# Patient Record
Sex: Male | Born: 1945 | Race: Black or African American | Hispanic: No | Smoking: Never smoker
Health system: Southern US, Community
[De-identification: ages and names within clinical notes are randomized; demographics above are authoritative.]

## PROBLEM LIST (undated history)

## (undated) DIAGNOSIS — T7840XA Allergy, unspecified, initial encounter: Secondary | ICD-10-CM

## (undated) DIAGNOSIS — M1990 Unspecified osteoarthritis, unspecified site: Secondary | ICD-10-CM

## (undated) HISTORY — DX: Unspecified osteoarthritis, unspecified site: M19.90

## (undated) HISTORY — DX: Allergy, unspecified, initial encounter: T78.40XA

---

## 2012-06-25 ENCOUNTER — Ambulatory Visit (INDEPENDENT_AMBULATORY_CARE_PROVIDER_SITE_OTHER): Payer: Medicare Other | Admitting: Family Medicine

## 2012-06-25 ENCOUNTER — Ambulatory Visit: Payer: Medicare Other

## 2012-06-25 VITALS — BP 168/90 | HR 68 | Temp 98.1°F | Resp 18 | Ht 64.5 in | Wt 177.0 lb

## 2012-06-25 DIAGNOSIS — E119 Type 2 diabetes mellitus without complications: Secondary | ICD-10-CM

## 2012-06-25 DIAGNOSIS — D649 Anemia, unspecified: Secondary | ICD-10-CM

## 2012-06-25 DIAGNOSIS — R112 Nausea with vomiting, unspecified: Secondary | ICD-10-CM

## 2012-06-25 DIAGNOSIS — J309 Allergic rhinitis, unspecified: Secondary | ICD-10-CM

## 2012-06-25 LAB — COMPREHENSIVE METABOLIC PANEL
AST: 32 U/L (ref 0–37)
Alkaline Phosphatase: 167 U/L — ABNORMAL HIGH (ref 39–117)
BUN: 52 mg/dL — ABNORMAL HIGH (ref 6–23)
Creat: 5.09 mg/dL — ABNORMAL HIGH (ref 0.50–1.35)
Potassium: 4.5 mEq/L (ref 3.5–5.3)
Total Bilirubin: 0.3 mg/dL (ref 0.3–1.2)

## 2012-06-25 LAB — POCT CBC
MCH, POC: 28.6 pg (ref 27–31.2)
MCHC: 31.8 g/dL (ref 31.8–35.4)
MID (cbc): 0.3 (ref 0–0.9)
MPV: 8.3 fL (ref 0–99.8)
POC MID %: 5.8 %M (ref 0–12)
Platelet Count, POC: 244 10*3/uL (ref 142–424)
RBC: 4.33 M/uL — AB (ref 4.69–6.13)
RDW, POC: 12.7 %
WBC: 5.7 10*3/uL (ref 4.6–10.2)

## 2012-06-25 NOTE — Patient Instructions (Signed)
pepto bismol for nausea if needed. Continue to drink plenty of fluids, small portion sizes and bland foods such as soup and crackers.  If less food intake, can decrease your insulin dose to 1/2 for the next 2 days, then recheck in office. Keep a record of your blood sugars 3 times per day and watch for any symptoms of low blood sugar - see below for precautions and treatment. If blood sugar reading hiher than 350 - call or return to clinic, or to an emergency room.  your lab results should be available at your next visit in 2 days, and we can discuss further workup of your symptoms at that time. Return to the clinic or go to the nearest emergency room if any of your symptoms worsen or new symptoms occur.  Hypoglycemia (Low Blood Sugar) Hypoglycemia is when the glucose (sugar) in your blood is too low. Hypoglycemia can happen for many reasons. It can happen to people with or without diabetes. Hypoglycemia can develop quickly and can be a medical emergency.  CAUSES  Having hypoglycemia does not mean that you will develop diabetes. Different causes include:  Missed or delayed meals or not enough carbohydrates eaten.  Medication overdose. This could be by accident or deliberate. If by accident, your medication may need to be adjusted or changed.  Exercise or increased activity without adjustments in carbohydrates or medications.  A nerve disorder that affects body functions like your heart rate, blood pressure and digestion (autonomic neuropathy).  A condition where the stomach muscles do not function properly (gastroparesis). Therefore, medications may not absorb properly.  The inability to recognize the signs of hypoglycemia (hypoglycemic unawareness).  Absorption of insulin  may be altered.  Alcohol consumption.  Pregnancy/menstrual cycles/postpartum. This may be due to hormones.  Certain kinds of tumors. This is very rare. SYMPTOMS   Sweating.  Hunger.  Dizziness.  Blurred  vision.  Drowsiness.  Weakness.  Headache.  Rapid heart beat.  Shakiness.  Nervousness. DIAGNOSIS  Diagnosis is made by monitoring blood glucose in one or all of the following ways:  Fingerstick blood glucose monitoring.  Laboratory results. TREATMENT  If you think your blood glucose is low:  Check your blood glucose, if possible. If it is less than 70 mg/dl, take one of the following:  3-4 glucose tablets.   cup juice (prefer clear like apple).   cup "regular" soda pop.  1 cup milk.  -1 tube of glucose gel.  5-6 hard candies.  Do not over treat because your blood glucose (sugar) will only go too high.  Wait 15 minutes and recheck your blood glucose. If it is still less than 70 mg/dl (or below your target range), repeat treatment.  Eat a snack if it is more than one hour until your next meal. Sometimes, your blood glucose may go so low that you are unable to treat yourself. You may need someone to help you. You may even pass out or be unable to swallow. This may require you to get an injection of glucagon, which raises the blood glucose. HOME CARE INSTRUCTIONS  Check blood glucose as recommended by your caregiver.  Take medication as prescribed by your caregiver.  Follow your meal plan. Do not skip meals. Eat on time.  If you are going to drink alcohol, drink it only with meals.  Check your blood glucose before driving.  Check your blood glucose before and after exercise. If you exercise longer or different than usual, be sure to check blood  glucose more frequently.  Always carry treatment with you. Glucose tablets are the easiest to carry.  Always wear medical alert jewelry or carry some form of identification that states that you have diabetes. This will alert people that you have diabetes. If you have hypoglycemia, they will have a better idea on what to do. SEEK MEDICAL CARE IF:   You are having problems keeping your blood sugar at target  range.  You are having frequent episodes of hypoglycemia.  You feel you might be having side effects from your medicines.  You have symptoms of an illness that is not improving after 3-4 days.  You notice a change in vision or a new problem with your vision. SEEK IMMEDIATE MEDICAL CARE IF:   You are a family member or friend of a person whose blood glucose goes below 70 mg/dl and is accompanied by:  Confusion.  A change in mental status.  The inability to swallow.  Passing out. Document Released: 03/13/2005 Document Revised: 06/05/2011 Document Reviewed: 07/10/2011 [facility] Patient Information 2013 [place], [place].

## 2012-06-25 NOTE — Progress Notes (Signed)
Subjective:    Patient ID: Jerome Davis, male    DOB: 11/22/1945, 67 y.o.   MRN: [id number]  HPI Jerome Davis is a 67 y.o. male Nausea, vomiting since 6 days ago - eased off some after 2 days, then worse past 4 days. Vomiting 1 hour after dinner last night was last episode. 2 episodes yesterday, 2 times per day few days prior as well - notes after eating. Has been trying to eat regular food. Drinking fluids ok.  Urinating ok, no burning, no frequency, no hematuria. No abd pain. No chest pain. No fever. No diarrhea. Last bm 3-4 days ago. Normal then.   L sinus congestion - every spring, current sx's x 1 week.  Hx of allergies, takes allergy medicine from the [facility] hospital - 1 pill once per day.    Hx of Dm2 for 22 years - takes 70/30 insulin  - 35 units in the morning, 12 units in the evening. - followed by [facility]. checking home blood sugars - 101-135.  No hx of heart disease/ptca/MI. Has not taken insulin yet this morning, or other medicines.   HTN - has not taken norvasc or tenormin.  SH: No known sick contacts.   Tx: pepto last night, and ginger ale.    Review of Systems  Constitutional: Negative for fever and chills.  HENT: Positive for congestion, sneezing and sinus pressure.   Eyes: Positive for itching.  Respiratory: Negative for chest tightness and shortness of breath.   Cardiovascular: Negative for chest pain.  Gastrointestinal: Positive for nausea and vomiting. Negative for abdominal pain, diarrhea, blood in stool and abdominal distention.  Genitourinary: Negative for dysuria, urgency, frequency, hematuria and difficulty urinating.       Objective:   Physical Exam  Vitals reviewed. Constitutional: He is oriented to person, place, and time. He appears well-developed and well-nourished.  HENT:  Head: Normocephalic and atraumatic.  Eyes: EOM are normal. Pupils are equal, round, and reactive to light.  Neck: No JVD present. Carotid bruit is not present.   Cardiovascular: Normal rate, regular rhythm and normal heart sounds.   No murmur heard. Pulmonary/Chest: Effort normal and breath sounds normal. He has no rales.  Abdominal: Soft. Normal appearance. Bowel sounds are increased. There is no tenderness. There is no rigidity, no guarding, no CVA tenderness, no tenderness at McBurney's point and negative Murphy's sign.  Musculoskeletal: He exhibits no edema.  Neurological: He is alert and oriented to person, place, and time.  Skin: Skin is warm and dry.  Psychiatric: He has a normal mood and affect.    Results for orders placed in visit on 06/25/12  POCT CBC      Result Value Range   WBC 5.7  4.6 - 10.2 K/uL   Lymph, poc 1.7  0.6 - 3.4   POC LYMPH PERCENT 30.6  10 - 50 %L   MID (cbc) 0.3  0 - 0.9   POC MID % 5.8  0 - 12 %M   POC Granulocyte 3.6  2 - 6.9   Granulocyte percent 63.6  37 - 80 %G   RBC 4.33 (*) 4.69 - 6.13 M/uL   Hemoglobin 12.4 (*) 14.1 - 18.1 g/dL   HCT, POC 30.8 (*) 65.7 - 53.7 %   MCV 90.1  80 - 97 fL   MCH, POC 28.6  27 - 31.2 pg   MCHC 31.8  31.8 - 35.4 g/dL   RDW, POC 84.6     Platelet Count, POC 244  142 - 424 K/uL   MPV 8.3  0 - 99.8 fL  GLUCOSE, POCT (MANUAL RESULT ENTRY)      Result Value Range   POC Glucose 374 (*) 70 - 99 mg/dl  IFOBT (OCCULT BLOOD)      Result Value Range   IFOBT Negative        [facility] reading (PRIMARY) by  Dr. Neva Seat: abd series - R hemidiaphragm elevation, increased stool, nonspecific otherwise.      Assessment & Plan:  Jerome Davis is a 67 y.o. male Nausea with vomiting - Plan: POCT CBC, POCT glucose (manual entry), Comprehensive metabolic panel, Lipase, DG Abd Acute W/Chest  Type II or unspecified type diabetes mellitus without mention of complication, not stated as uncontrolled - Plan: POCT CBC, POCT glucose (manual entry), Comprehensive metabolic panel, Lipase, DG Abd Acute W/Chest  Anemia - Plan: IFOBT POC (occult bld, rslt in office)  Allergic rhinitis    Nausea with  vomiting - 2 episodes each day, post prandial only.  Underlying DM - ddx includes gastroparesis, but likely viral gastritis.  No abd pain, and reassuring WBC on CBC. pepto bismol, drinking fluids, bland foods - saltines, mashed potatoes or soup, and recheck in 48 hours. Small portions discussed.  Lipase and cmp pending. May need to see GI if persistent.   Anemia- no known hx of this. No prior labs available for review. Hemosure negative.  recheck in 48 hours for stability, then to follow up with primary provider or GI.   Dm2 - insulin dependent - reading high in office off meds, but overall ok on home readings recently with diet. Can decrease dose by 1/2 if less po intake. Check blood sugars 3 times per day and recheck in 48 hours.  Hypo and hyperglycemic precautions given.   HTN - uncontrolled this am off meds - restart med and check home bp's.   Sinus congestion - allergic rhinitis - continue rx from primary provider.  rtc if persistent purulent nasal discharge.   Patient Instructions  pepto bismol for nausea if needed. Continue to drink plenty of fluids, small portion sizes and bland foods such as soup and crackers.  If less food intake, can decrease your insulin dose to 1/2 for the next 2 days, then recheck in office. Keep a record of your blood sugars 3 times per day and watch for any symptoms of low blood sugar - see below for precautions and treatment. If blood sugar reading hiher than 350 - call or return to clinic, or to an emergency room.  your lab results should be available at your next visit in 2 days, and we can discuss further workup of your symptoms at that time. Return to the clinic or go to the nearest emergency room if any of your symptoms worsen or new symptoms occur.  Hypoglycemia (Low Blood Sugar) Hypoglycemia is when the glucose (sugar) in your blood is too low. Hypoglycemia can happen for many reasons. It can happen to people with or without diabetes. Hypoglycemia can develop  quickly and can be a medical emergency.  CAUSES  Having hypoglycemia does not mean that you will develop diabetes. Different causes include:  Missed or delayed meals or not enough carbohydrates eaten.  Medication overdose. This could be by accident or deliberate. If by accident, your medication may need to be adjusted or changed.  Exercise or increased activity without adjustments in carbohydrates or medications.  A nerve disorder that affects body functions like your heart rate, blood pressure and  digestion (autonomic neuropathy).  A condition where the stomach muscles do not function properly (gastroparesis). Therefore, medications may not absorb properly.  The inability to recognize the signs of hypoglycemia (hypoglycemic unawareness).  Absorption of insulin  may be altered.  Alcohol consumption.  Pregnancy/menstrual cycles/postpartum. This may be due to hormones.  Certain kinds of tumors. This is very rare. SYMPTOMS   Sweating.  Hunger.  Dizziness.  Blurred vision.  Drowsiness.  Weakness.  Headache.  Rapid heart beat.  Shakiness.  Nervousness. DIAGNOSIS  Diagnosis is made by monitoring blood glucose in one or all of the following ways:  Fingerstick blood glucose monitoring.  Laboratory results. TREATMENT  If you think your blood glucose is low:  Check your blood glucose, if possible. If it is less than 70 mg/dl, take one of the following:  3-4 glucose tablets.   cup juice (prefer clear like apple).   cup "regular" soda pop.  1 cup milk.  -1 tube of glucose gel.  5-6 hard candies.  Do not over treat because your blood glucose (sugar) will only go too high.  Wait 15 minutes and recheck your blood glucose. If it is still less than 70 mg/dl (or below your target range), repeat treatment.  Eat a snack if it is more than one hour until your next meal. Sometimes, your blood glucose may go so low that you are unable to treat yourself. You may  need someone to help you. You may even pass out or be unable to swallow. This may require you to get an injection of glucagon, which raises the blood glucose. HOME CARE INSTRUCTIONS  Check blood glucose as recommended by your caregiver.  Take medication as prescribed by your caregiver.  Follow your meal plan. Do not skip meals. Eat on time.  If you are going to drink alcohol, drink it only with meals.  Check your blood glucose before driving.  Check your blood glucose before and after exercise. If you exercise longer or different than usual, be sure to check blood glucose more frequently.  Always carry treatment with you. Glucose tablets are the easiest to carry.  Always wear medical alert jewelry or carry some form of identification that states that you have diabetes. This will alert people that you have diabetes. If you have hypoglycemia, they will have a better idea on what to do. SEEK MEDICAL CARE IF:   You are having problems keeping your blood sugar at target range.  You are having frequent episodes of hypoglycemia.  You feel you might be having side effects from your medicines.  You have symptoms of an illness that is not improving after 3-4 days.  You notice a change in vision or a new problem with your vision. SEEK IMMEDIATE MEDICAL CARE IF:   You are a family member or friend of a person whose blood glucose goes below 70 mg/dl and is accompanied by:  Confusion.  A change in mental status.  The inability to swallow.  Passing out. Document Released: 03/13/2005 Document Revised: 06/05/2011 Document Reviewed: 07/10/2011 [facility] Patient Information 2013 [place], [place].

## 2012-06-27 ENCOUNTER — Telehealth: Payer: Self-pay | Admitting: Family Medicine

## 2012-06-27 NOTE — Telephone Encounter (Signed)
Left message to call back asap for urgent lab result.  See labs - creatinine 5.09 without prior reference reading.

## 2012-06-27 NOTE — Telephone Encounter (Signed)
Talked to patient.  No further vomiting since last office visit, and is feeling better. Has been told slightly elevated kidney test, but planned on "keeping eye on it". No new medicines were prescribed in past. Did have elevation in past, but it went back down by his report.  Never hospitalized. Discussed need for urgent evaluation tonight for recheck creatinine - recommended ER for eval which he refused. Offered to have him seen by one of our providers tonight which he also refused, as he plans to go the [facility] hospital tomorrow morning. I repetitively advised him of the risks of not having this evaluated/rechecked tonight including but not limited to progressive renal failure, dialysis, coma or possible death. He expressed understanding of these risks and still chooses to follow up tomorrow at the [facility] hospital. I advised a copy of this lab will be made available for him to pick up for his visit at the [facility] tomorrow, and will have a team leader attempt to fax the lab results, this call record, and the office visit to his primary provider at the [facility] hospital.

## 2012-06-28 NOTE — Telephone Encounter (Signed)
called

## 2012-06-28 NOTE — Telephone Encounter (Signed)
ca

## 2012-06-28 NOTE — Telephone Encounter (Signed)
Left message for him to call me back. [facility] Also if he can come by and get these before he goes.

## 2012-06-28 NOTE — Telephone Encounter (Signed)
Called again, left another message.

## 2012-07-01 NOTE — Telephone Encounter (Signed)
Have left another message

## 2012-07-02 NOTE — Telephone Encounter (Signed)
Unable to reach letter has been sent

## 2012-07-16 NOTE — Telephone Encounter (Signed)
Please try to call patient again. Did he follow up with the [facility] hospital after our last office visit regarding the elevated creatinine?  If not needs recheck here or other care provider as soon as possible. If unable to reach patient, please send certified letter. Thanks.

## 2012-07-19 NOTE — Telephone Encounter (Signed)
Okay, I called and left message again for him to call me back.

## 2012-07-22 NOTE — Telephone Encounter (Signed)
Several more attempts, patient has not returned my calls. To you FYI

## 2012-07-22 NOTE — Telephone Encounter (Signed)
Noted.  Please send another unable to reach letter - certified mail if possible.

## 2012-07-23 NOTE — Telephone Encounter (Signed)
Letter on your desk, will you please send certified mail?

## 2013-04-16 ENCOUNTER — Other Ambulatory Visit (HOSPITAL_COMMUNITY): Payer: Self-pay | Admitting: *Deleted

## 2013-04-17 ENCOUNTER — Encounter (HOSPITAL_COMMUNITY)
Admission: RE | Admit: 2013-04-17 | Discharge: 2013-04-17 | Disposition: A | Discharge status: Home / Self Care | Payer: Medicare Other | Source: Ambulatory Visit | Attending: Nephrology | Admitting: Nephrology

## 2013-04-17 DIAGNOSIS — D638 Anemia in other chronic diseases classified elsewhere: Secondary | ICD-10-CM | POA: Insufficient documentation

## 2013-04-17 DIAGNOSIS — N185 Chronic kidney disease, stage 5: Secondary | ICD-10-CM | POA: Insufficient documentation

## 2013-04-17 LAB — POCT HEMOGLOBIN-HEMACUE: Hemoglobin: 11.4 g/dL — ABNORMAL LOW (ref 13.0–17.0)

## 2013-04-17 MED ORDER — EPOETIN ALFA 20000 UNIT/ML IJ SOLN
INTRAMUSCULAR | Status: AC
  Administered 2013-04-17: 20000 [IU] via SUBCUTANEOUS
  Filled 2013-04-17: qty 1

## 2013-04-17 MED ORDER — EPOETIN ALFA 20000 UNIT/ML IJ SOLN
20000.0000 [IU] | INTRAMUSCULAR | Status: DC
  Administered 2013-04-17: 20000 [IU] via SUBCUTANEOUS

## 2013-05-01 ENCOUNTER — Encounter (HOSPITAL_COMMUNITY)
Admission: RE | Admit: 2013-05-01 | Discharge: 2013-05-01 | Disposition: A | Discharge status: Home / Self Care | Payer: Medicare Other | Source: Ambulatory Visit | Attending: Nephrology | Admitting: Nephrology

## 2013-05-01 DIAGNOSIS — N185 Chronic kidney disease, stage 5: Secondary | ICD-10-CM | POA: Insufficient documentation

## 2013-05-01 DIAGNOSIS — D638 Anemia in other chronic diseases classified elsewhere: Secondary | ICD-10-CM | POA: Insufficient documentation

## 2013-05-01 LAB — IRON AND TIBC
IRON: 31 ug/dL — AB (ref 42–135)
SATURATION RATIOS: 12 % — AB (ref 20–55)
TIBC: 258 ug/dL (ref 215–435)
UIBC: 227 ug/dL (ref 125–400)

## 2013-05-01 LAB — FERRITIN: FERRITIN: 173 ng/mL (ref 22–322)

## 2013-05-01 LAB — POCT HEMOGLOBIN-HEMACUE: Hemoglobin: 10.1 g/dL — ABNORMAL LOW (ref 13.0–17.0)

## 2013-05-01 MED ORDER — EPOETIN ALFA 20000 UNIT/ML IJ SOLN
INTRAMUSCULAR | Status: AC
  Filled 2013-05-01: qty 1

## 2013-05-01 MED ORDER — EPOETIN ALFA 20000 UNIT/ML IJ SOLN
20000.0000 [IU] | INTRAMUSCULAR | Status: DC
  Administered 2013-05-01: 20000 [IU] via SUBCUTANEOUS

## 2013-05-15 ENCOUNTER — Encounter (HOSPITAL_COMMUNITY): Payer: TRICARE For Life (TFL)

## 2013-05-25 DEATH — deceased

## 2013-09-19 IMAGING — CR DG ABDOMEN ACUTE W/ 1V CHEST
3 series · 3 of 3 positions shown · non-contrast
Comparison: None.

CLINICAL DATA: Nausea vomiting diarrhea.

ACUTE ABDOMEN SERIES (ABDOMEN 2 VIEW & CHEST 1 VIEW)

[PA]
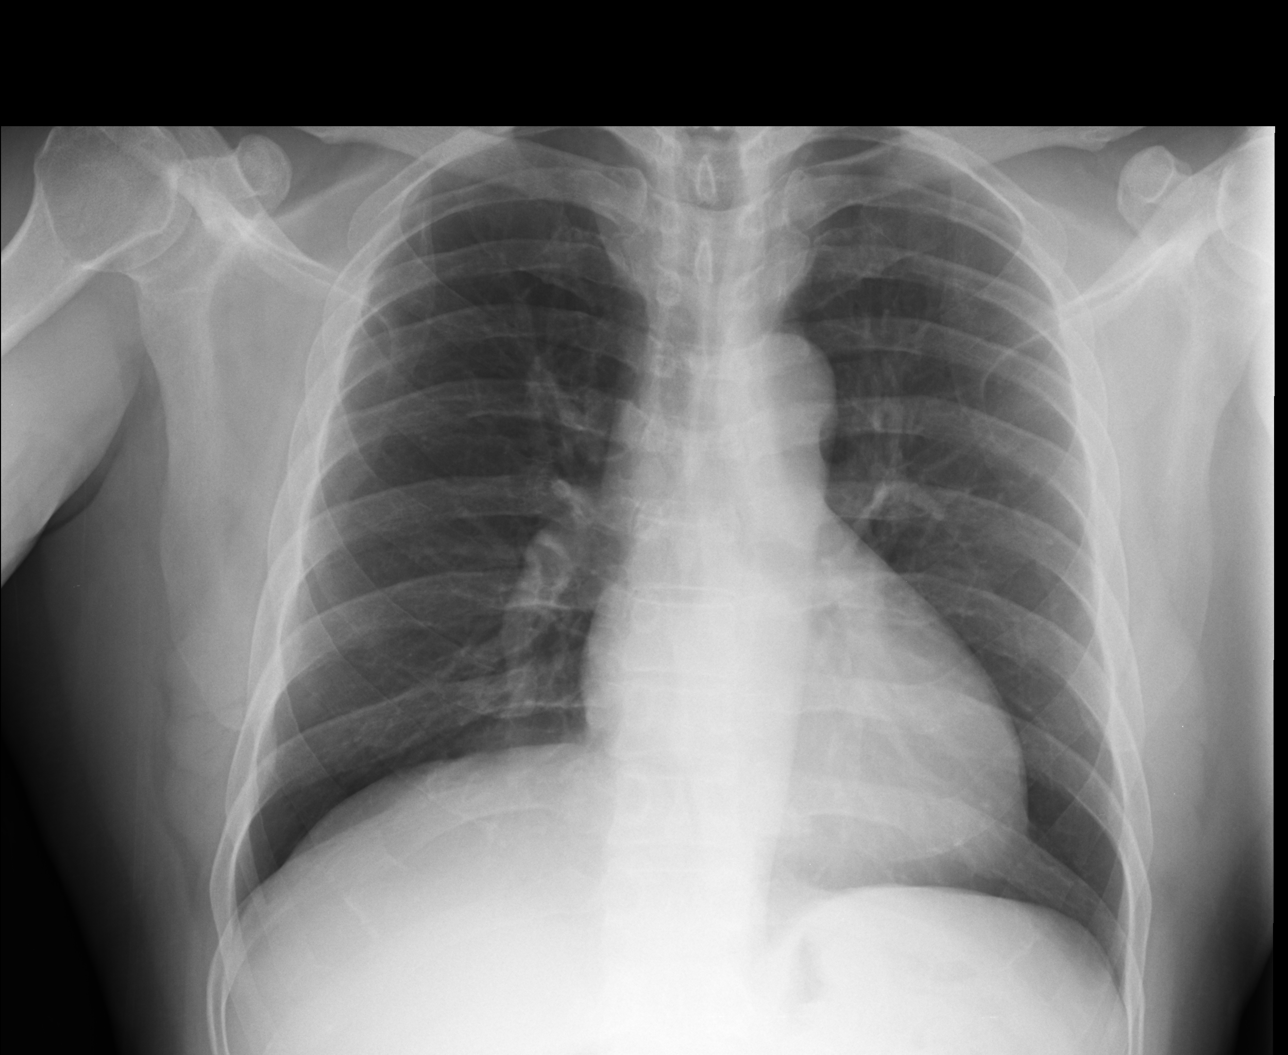

[AP (1 of 2)]
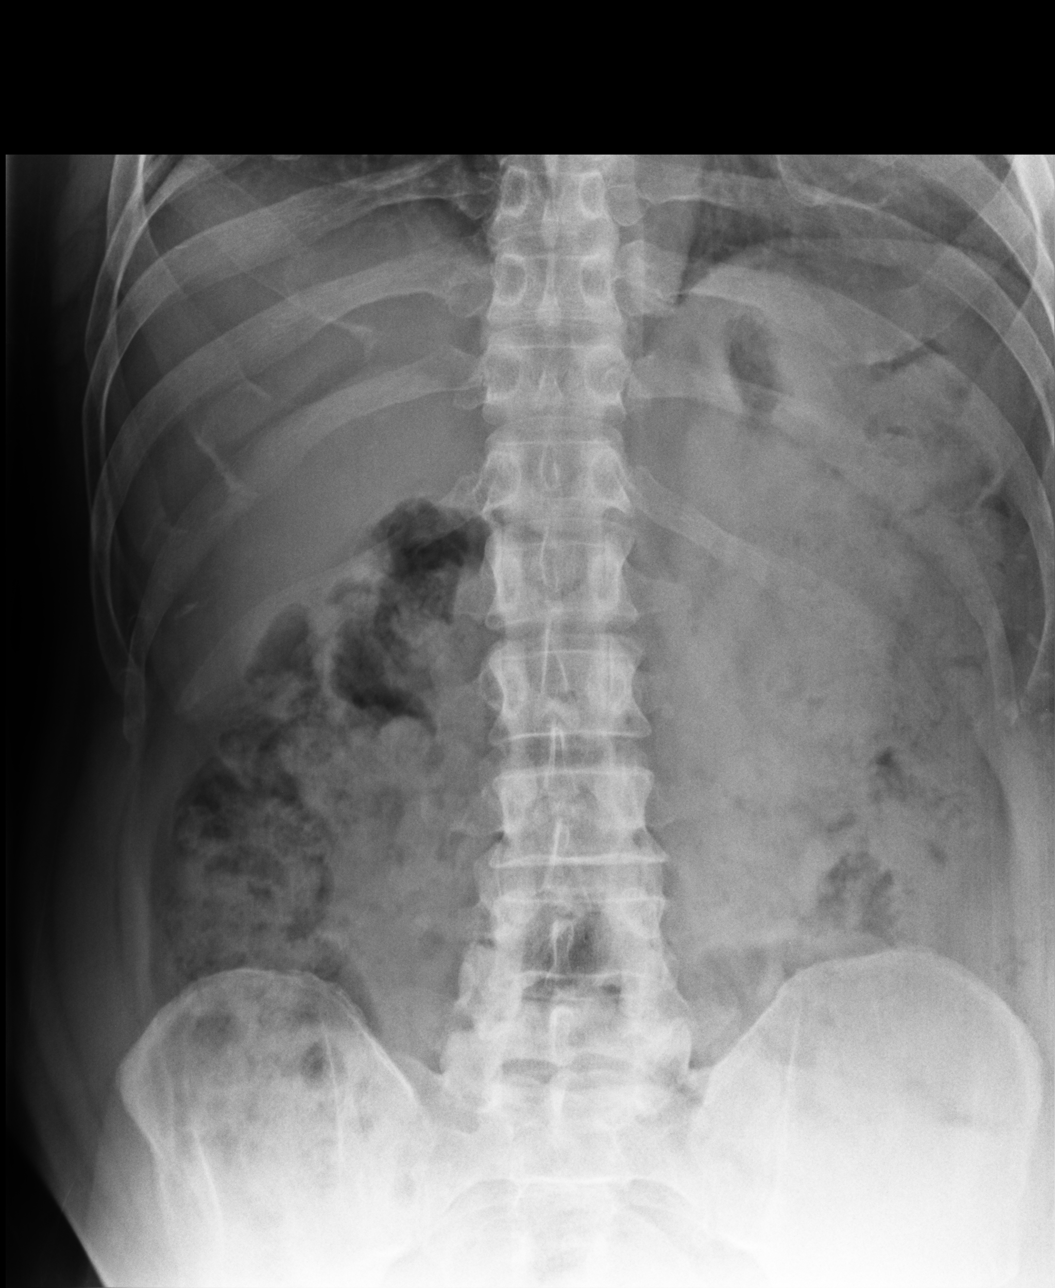

[AP (2 of 2)]
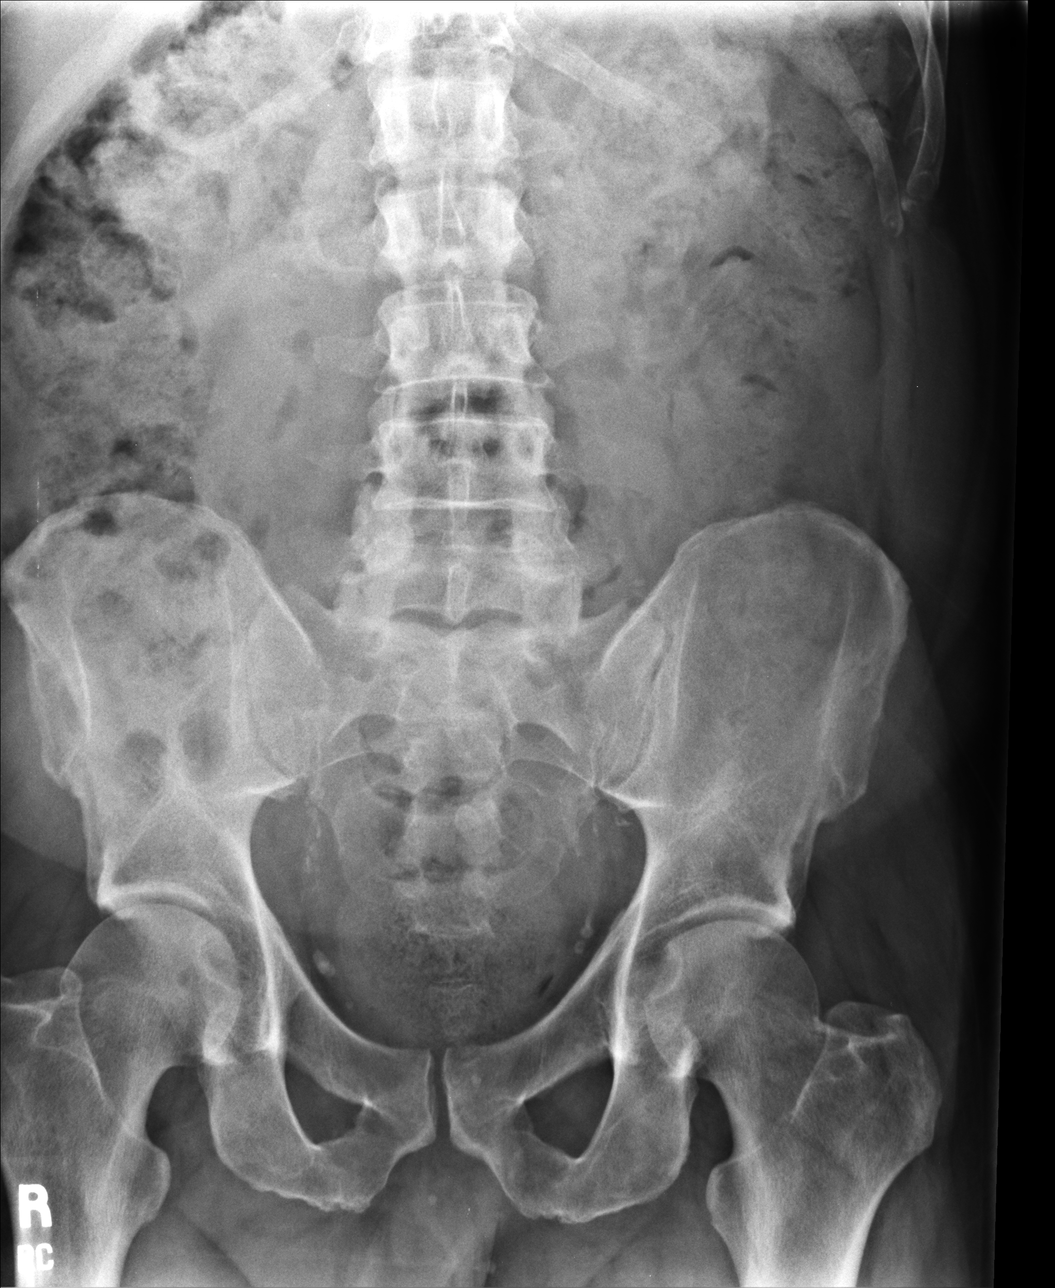

[3 of 3 positions shown; findings below may reference images not displayed]

FINDINGS: Frontal view of the chest demonstrates midline trachea.
Mild right hemidiaphragm elevation. Normal heart size and
mediastinal contours. No pleural effusion or pneumothorax.  Clear
lungs.

Abominal films demonstrate moderate to marked colonic stool.  No
small bowel dilatation.  No abnormal abdominal calcifications.
Vascular calcifications in the pelvis.  Moderate stool within the
rectum.
IMPRESSION: Possible constipation.

No acute cardiopulmonary disease.
